# Patient Record
Sex: Male | Born: 1995 | Race: Black or African American | Hispanic: No | Marital: Single | Smoking: Never smoker
Health system: Southern US, Community
[De-identification: ages and names within clinical notes are randomized; demographics above are authoritative.]

---

## 2019-11-15 ENCOUNTER — Ambulatory Visit: Payer: Self-pay | Attending: Family

## 2019-11-15 DIAGNOSIS — Z23 Encounter for immunization: Secondary | ICD-10-CM

## 2019-11-15 NOTE — Progress Notes (Signed)
   Covid-19 Vaccination Clinic  Name:  Jeremy Harris    MRN: [id number] DOB: 1996-01-31  11/15/2019  Jeremy Harris was observed post Covid-19 immunization for 15 minutes without incident. He was provided with Vaccine Information Sheet and instruction to access the V-Safe system.   Jeremy Harris was instructed to call 911 with any severe reactions post vaccine: Marland Kitchen Difficulty breathing  . Swelling of face and throat  . A fast heartbeat  . A bad rash all over body  . Dizziness and weakness   Immunizations Administered    Name Date Dose VIS Date Route   Moderna COVID-19 Vaccine 11/15/2019  1:44 PM 0.5 mL 07/24/2019 Intramuscular   Manufacturer: Moderna   Lot: [id number]   NDC: 34373-578-97

## 2019-12-18 ENCOUNTER — Ambulatory Visit: Payer: Medicaid Other | Attending: Internal Medicine

## 2019-12-27 ENCOUNTER — Ambulatory Visit: Payer: Medicaid Other | Attending: Internal Medicine

## 2019-12-27 DIAGNOSIS — Z23 Encounter for immunization: Secondary | ICD-10-CM

## 2019-12-27 NOTE — Progress Notes (Signed)
   Covid-19 Vaccination Clinic  Name:  Jeremy Harris    MRN: [id number] DOB: 04/02/1996  12/27/2019  Mr. [name] was observed post Covid-19 immunization for 15 minutes without incident. He was provided with Vaccine Information Sheet and instruction to access the V-Safe system.   Mr. [name] was instructed to call 911 with any severe reactions post vaccine: Marland Kitchen Difficulty breathing  . Swelling of face and throat  . A fast heartbeat  . A bad rash all over body  . Dizziness and weakness   Immunizations Administered    Name Date Dose VIS Date Route   Moderna COVID-19 Vaccine 12/27/2019  1:20 PM 0.5 mL 07/2019 Intramuscular   Manufacturer: Moderna   Lot: [id number]   NDC: 25910-289-02

## 2020-06-21 ENCOUNTER — Emergency Department (HOSPITAL_COMMUNITY): Payer: BC Managed Care – PPO

## 2020-06-21 ENCOUNTER — Emergency Department (HOSPITAL_COMMUNITY)
Admission: EM | Admit: 2020-06-21 | Discharge: 2020-06-21 | Disposition: A | Discharge status: Home / Self Care | Payer: BC Managed Care – PPO | Attending: Emergency Medicine | Admitting: Emergency Medicine

## 2020-06-21 ENCOUNTER — Encounter (HOSPITAL_COMMUNITY): Payer: Self-pay | Admitting: Emergency Medicine

## 2020-06-21 ENCOUNTER — Other Ambulatory Visit: Payer: Self-pay

## 2020-06-21 DIAGNOSIS — S161XXA Strain of muscle, fascia and tendon at neck level, initial encounter: Secondary | ICD-10-CM

## 2020-06-21 DIAGNOSIS — S199XXA Unspecified injury of neck, initial encounter: Secondary | ICD-10-CM | POA: Diagnosis present

## 2020-06-21 DIAGNOSIS — Y9389 Activity, other specified: Secondary | ICD-10-CM | POA: Insufficient documentation

## 2020-06-21 DIAGNOSIS — R519 Headache, unspecified: Secondary | ICD-10-CM | POA: Diagnosis not present

## 2020-06-21 DIAGNOSIS — Y92415 Exit ramp or entrance ramp of street or highway as the place of occurrence of the external cause: Secondary | ICD-10-CM | POA: Insufficient documentation

## 2020-06-21 DIAGNOSIS — Y999 Unspecified external cause status: Secondary | ICD-10-CM | POA: Insufficient documentation

## 2020-06-21 MED ORDER — KETOROLAC TROMETHAMINE 30 MG/ML IJ SOLN
30.0000 mg | Freq: Once | INTRAMUSCULAR | Status: AC
  Administered 2020-06-21: 30 mg via INTRAMUSCULAR
  Filled 2020-06-21: qty 1

## 2020-06-21 MED ORDER — IBUPROFEN 600 MG PO TABS: 600.0000 mg | ORAL_TABLET | Freq: Four times a day (QID) | ORAL | 0 refills | Status: AC | PRN

## 2020-06-21 MED ORDER — CYCLOBENZAPRINE HCL 10 MG PO TABS: 10.0000 mg | ORAL_TABLET | Freq: Two times a day (BID) | ORAL | 0 refills | Status: AC | PRN
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
